# Patient Record
Sex: Female | Born: 1940 | Race: White | Hispanic: No | Smoking: Never smoker
Health system: Southern US, Community
[De-identification: ages and names within clinical notes are randomized; demographics above are authoritative.]

## PROBLEM LIST (undated history)

## (undated) DIAGNOSIS — E079 Disorder of thyroid, unspecified: Secondary | ICD-10-CM

## (undated) DIAGNOSIS — E785 Hyperlipidemia, unspecified: Secondary | ICD-10-CM

## (undated) DIAGNOSIS — I219 Acute myocardial infarction, unspecified: Secondary | ICD-10-CM

## (undated) HISTORY — PX: CHOLECYSTECTOMY: SHX55

## (undated) HISTORY — PX: HERNIA REPAIR: SHX51

---

## 2010-12-13 DIAGNOSIS — I219 Acute myocardial infarction, unspecified: Secondary | ICD-10-CM

## 2010-12-13 HISTORY — DX: Acute myocardial infarction, unspecified: I21.9

## 2010-12-13 HISTORY — PX: CORONARY STENT PLACEMENT: SHX1402

## 2016-01-14 ENCOUNTER — Encounter (HOSPITAL_COMMUNITY): Payer: Self-pay | Admitting: Emergency Medicine

## 2016-01-14 ENCOUNTER — Emergency Department (HOSPITAL_COMMUNITY)
Admission: EM | Admit: 2016-01-14 | Discharge: 2016-01-14 | Disposition: A | Discharge status: Home / Self Care | Payer: Medicare Other | Attending: Emergency Medicine | Admitting: Emergency Medicine

## 2016-01-14 ENCOUNTER — Emergency Department (HOSPITAL_COMMUNITY): Payer: Medicare Other

## 2016-01-14 DIAGNOSIS — S0101XA Laceration without foreign body of scalp, initial encounter: Secondary | ICD-10-CM | POA: Insufficient documentation

## 2016-01-14 DIAGNOSIS — Y998 Other external cause status: Secondary | ICD-10-CM | POA: Insufficient documentation

## 2016-01-14 DIAGNOSIS — S0191XA Laceration without foreign body of unspecified part of head, initial encounter: Secondary | ICD-10-CM

## 2016-01-14 DIAGNOSIS — Y9354 Activity, bowling: Secondary | ICD-10-CM | POA: Diagnosis not present

## 2016-01-14 DIAGNOSIS — Y9239 Other specified sports and athletic area as the place of occurrence of the external cause: Secondary | ICD-10-CM | POA: Insufficient documentation

## 2016-01-14 DIAGNOSIS — W01198A Fall on same level from slipping, tripping and stumbling with subsequent striking against other object, initial encounter: Secondary | ICD-10-CM | POA: Diagnosis not present

## 2016-01-14 DIAGNOSIS — I10 Essential (primary) hypertension: Secondary | ICD-10-CM | POA: Diagnosis not present

## 2016-01-14 DIAGNOSIS — S199XXA Unspecified injury of neck, initial encounter: Secondary | ICD-10-CM | POA: Insufficient documentation

## 2016-01-14 DIAGNOSIS — W19XXXA Unspecified fall, initial encounter: Secondary | ICD-10-CM

## 2016-01-14 DIAGNOSIS — S0990XA Unspecified injury of head, initial encounter: Secondary | ICD-10-CM | POA: Diagnosis present

## 2016-01-14 HISTORY — DX: Disorder of thyroid, unspecified: E07.9

## 2016-01-14 HISTORY — DX: Acute myocardial infarction, unspecified: I21.9

## 2016-01-14 HISTORY — DX: Hyperlipidemia, unspecified: E78.5

## 2016-01-14 MED ORDER — LIDOCAINE-EPINEPHRINE (PF) 2 %-1:200000 IJ SOLN
10.0000 mL | Freq: Once | INTRAMUSCULAR | Status: AC
  Administered 2016-01-14: 10 mL
  Filled 2016-01-14: qty 10

## 2016-01-14 MED ORDER — OXYCODONE-ACETAMINOPHEN 5-325 MG PO TABS: 2.0000 | ORAL_TABLET | Freq: Once | ORAL | Status: DC

## 2016-01-14 MED ORDER — ACETAMINOPHEN 325 MG PO TABS
650.0000 mg | ORAL_TABLET | Freq: Once | ORAL | Status: AC
  Administered 2016-01-14: 650 mg via ORAL
  Filled 2016-01-14: qty 2

## 2016-01-14 NOTE — Discharge Instructions (Signed)
Please go to your Primary Care Physician, an Urgent Care or return to the Emergency Department to have your staples or sutures removed 7 -10 days from today.Head Injury, Adult You have received a head injury. It does not appear serious at this time. Headaches and vomiting are common following head injury. It should be easy to awaken from sleeping. Sometimes it is necessary for you to stay in the emergency department for a while for observation. Sometimes admission to the hospital may be needed. After injuries such as yours, most problems occur within the first 24 hours, but side effects may occur up to 7-10 days after the injury. It is important for you to carefully monitor your condition and contact your health care provider or seek immediate medical care if there is a change in your condition. WHAT ARE THE TYPES OF HEAD INJURIES? Head injuries can be as minor as a bump. Some head injuries can be more severe. More severe head injuries include:  A jarring injury to the brain (concussion).  A bruise of the brain (contusion). This mean there is bleeding in the brain that can cause swelling.  A cracked skull (skull fracture).  Bleeding in the brain that collects, clots, and forms a bump (hematoma). WHAT CAUSES A HEAD INJURY? A serious head injury is most likely to happen to someone who is in a car wreck and is not wearing a seat belt. Other causes of major head injuries include bicycle or motorcycle accidents, sports injuries, and falls. HOW ARE HEAD INJURIES DIAGNOSED? A complete history of the event leading to the injury and your current symptoms will be helpful in diagnosing head injuries. Many times, pictures of the brain, such as CT or MRI are needed to see the extent of the injury. Often, an overnight hospital stay is necessary for observation.  WHEN SHOULD I SEEK IMMEDIATE MEDICAL CARE?  You should get help right away if:  You have confusion or drowsiness.  You feel sick to your stomach  (nauseous) or have continued, forceful vomiting.  You have dizziness or unsteadiness that is getting worse.  You have severe, continued headaches not relieved by medicine. Only take over-the-counter or prescription medicines for pain, fever, or discomfort as directed by your health care provider.  You do not have normal function of the arms or legs or are unable to walk.  You notice changes in the black spots in the center of the colored part of your eye (pupil).  You have a clear or bloody fluid coming from your nose or ears.  You have a loss of vision. During the next 24 hours after the injury, you must stay with someone who can watch you for the warning signs. This person should contact local emergency services (911 in the [place] if you have seizures, you become unconscious, or you are unable to wake up. HOW CAN I PREVENT A HEAD INJURY IN THE FUTURE? The most important factor for preventing major head injuries is avoiding motor vehicle accidents. To minimize the potential for damage to your head, it is crucial to wear seat belts while riding in motor vehicles. Wearing helmets while bike riding and playing collision sports (like football) is also helpful. Also, avoiding dangerous activities around the house will further help reduce your risk of head injury.  WHEN CAN I RETURN TO NORMAL ACTIVITIES AND ATHLETICS? You should be reevaluated by your health care provider before returning to these activities. If you have any of the following symptoms, you should not return  to activities or contact sports until 1 week after the symptoms have stopped:  Persistent headache.  Dizziness or vertigo.  Poor attention and concentration.  Confusion.  Memory problems.  Nausea or vomiting.  Fatigue or tire easily.  Irritability.  Intolerant of bright lights or loud noises.  Anxiety or depression.  Disturbed sleep. MAKE SURE YOU:   Understand these instructions.  Will watch your  condition.  Will get help right away if you are not doing well or get worse.   This information is not intended to replace advice given to you by your health care provider. Make sure you discuss any questions you have with your health care provider.   Document Released: 11/29/2005 Document Revised: 12/20/2014 Document Reviewed: 08/06/2013 Elsevier Interactive Patient Education [employer].

## 2016-01-14 NOTE — ED Notes (Signed)
MD at bedside. 

## 2016-01-14 NOTE — ED Provider Notes (Signed)
CSN: [id number]     Arrival date & time 01/14/16  1959 History   First MD Initiated Contact with Patient 01/14/16 2000     Chief Complaint  Patient presents with  . Fall     (Consider location/radiation/quality/duration/timing/severity/associated sxs/prior Treatment) HPI   75 year old female who slipped and fell backwards while bowling this evening. She denies loss of consciousness. She had significant amount of bleeding noted but has been controlled by EMS. EMS was called. She did not get up prior to EMS transporting her. She denies any other injury besides the back of head she states she has some pain on the left side of her neck. She denies any changes in vision, difficulty speaking, or lateralized weakness noted. She states that she is on some type of a blood thinner but is not sure what it is.  History reviewed. No pertinent past medical history. History reviewed. No pertinent past surgical history. No family history on file. Social History  Substance Use Topics  . Smoking status: None  . Smokeless tobacco: None  . Alcohol Use: None   OB History    No data available     Review of Systems  All other systems reviewed and are negative.     Allergies  Review of patient's allergies indicates not on file.  Home Medications   Prior to Admission medications   Not on File   BP 196/112 mmHg  Pulse 79  Temp(Src) 97.7 F (36.5 C) (Oral)  Resp 16  SpO2 100% Physical Exam  Constitutional: She appears well-developed and well-nourished. No distress.  HENT:  Head: Normocephalic.    Right Ear: External ear normal.  Left Ear: External ear normal.  Nose: Nose normal.  Mouth/Throat: Oropharynx is clear and moist.  Laceration with blood matted in hair.  Eyes: Conjunctivae and EOM are normal. Pupils are equal, round, and reactive to light.  Neck: Normal range of motion.    Nursing note and vitals reviewed.   ED Course  .Marland KitchenLaceration Repair Date/Time: 01/14/2016 9:48  PM Performed by: Margarita Grizzle Authorized by: Margarita Grizzle Consent: Verbal consent obtained. Risks and benefits: risks, benefits and alternatives were discussed Consent given by: patient Required items: required blood products, implants, devices, and special equipment available Patient identity confirmed: verbally with patient Time out: Immediately prior to procedure a "time out" was called to verify the correct patient, procedure, equipment, support staff and site/side marked as required. Body area: head/neck Location details: scalp Laceration length: 3 cm Foreign bodies: no foreign bodies Tendon involvement: none Nerve involvement: none Vascular damage: no Anesthesia: local infiltration Local anesthetic: lidocaine 1% with epinephrine Anesthetic total: 2 ml Patient sedated: no Preparation: Patient was prepped and draped in the usual sterile fashion. Irrigation solution: saline Amount of cleaning: standard Debridement: none Degree of undermining: none Skin closure: staples Number of sutures: 3 Technique: simple Approximation: close Approximation difficulty: simple Patient tolerance: Patient tolerated the procedure well with no immediate complications   (including critical care time) Labs Review Labs Reviewed - No data to display  Imaging Review Ct Head Wo Contrast  01/14/2016  CLINICAL DATA:  Fall. EXAM: CT HEAD WITHOUT CONTRAST CT CERVICAL SPINE WITHOUT CONTRAST TECHNIQUE: Multidetector CT imaging of the head and cervical spine was performed following the standard protocol without intravenous contrast. Multiplanar CT image reconstructions of the cervical spine were also generated. COMPARISON:  None. FINDINGS: CT HEAD FINDINGS Prominence of the sulci and ventricles are identified compatible with brain atrophy. There is mild low attenuation within the subcortical  and periventricular white matter compatible with chronic microvascular disease. No evidence for acute brain infarct,  intracranial hemorrhage or mass. The paranasal sinuses are clear. The mastoid air cells are also clear. The calvarium is intact. Left posterior scalp hematoma is identified measuring 3.5 cm. CT CERVICAL SPINE FINDINGS Reversal of normal cervical lordosis. There is multi level disc space narrowing and ventral endplate spurring compatible with degenerative disc disease. The facet joints appear well-aligned. The prevertebral soft tissue space is normal. IMPRESSION: 1. No acute intracranial abnormalities. 2. Chronic microvascular disease and brain atrophy. 3. No evidence for cervical spine fracture. 4. Multilevel cervical spondylosis. 5. Reversal of normal cervical lordosis which may reflect muscle spasm or patient positioning. Electronically Signed   By: Signa Kell M.D.   On: 01/14/2016 20:57   Ct Cervical Spine Wo Contrast  01/14/2016  CLINICAL DATA:  Fall. EXAM: CT HEAD WITHOUT CONTRAST CT CERVICAL SPINE WITHOUT CONTRAST TECHNIQUE: Multidetector CT imaging of the head and cervical spine was performed following the standard protocol without intravenous contrast. Multiplanar CT image reconstructions of the cervical spine were also generated. COMPARISON:  None. FINDINGS: CT HEAD FINDINGS Prominence of the sulci and ventricles are identified compatible with brain atrophy. There is mild low attenuation within the subcortical and periventricular white matter compatible with chronic microvascular disease. No evidence for acute brain infarct, intracranial hemorrhage or mass. The paranasal sinuses are clear. The mastoid air cells are also clear. The calvarium is intact. Left posterior scalp hematoma is identified measuring 3.5 cm. CT CERVICAL SPINE FINDINGS Reversal of normal cervical lordosis. There is multi level disc space narrowing and ventral endplate spurring compatible with degenerative disc disease. The facet joints appear well-aligned. The prevertebral soft tissue space is normal. IMPRESSION: 1. No acute  intracranial abnormalities. 2. Chronic microvascular disease and brain atrophy. 3. No evidence for cervical spine fracture. 4. Multilevel cervical spondylosis. 5. Reversal of normal cervical lordosis which may reflect muscle spasm or patient positioning. Electronically Signed   By: Signa Kell M.D.   On: 01/14/2016 20:57   I have personally reviewed and evaluated these images and lab results as part of my medical decision-making.   EKG Interpretation None      MDM   Final diagnoses:  Laceration of head, initial encounter  Fall, initial encounter  Essential hypertension    75 year old female who fell tonight while bowling. Laceration to occiput repaired here with staples. Head CT reveals no evidence of acute intracranial hemorrhage. Patient is hypertensive. Recheck blood pressure. Likely elevated due to emergency situation, pain. Patient instructed to have recheck blood pressure with her primary care doctor. She is to have staples out in 7 days.  . Laceration occurred < 8 hours prior to repair which was well tolerated. Pt has no co morbidities to effect normal wound healing. Discussed suture home care w pt and answered questions. Pt to f-u for wound check and suture removal in 7 days. Pt is hemodynamically stable w no complaints prior to dc.       Margarita Grizzle, MD 01/14/16 [phone]

## 2016-01-14 NOTE — ED Notes (Signed)
Pt presents to ED with [facility] from [place] where she slid and fell backward striking posterior head; pt denies LOC; EMS notes lac to posterior head with bleeding controlled; pt reports anticoagulant use but unknown name; pt also c/o L lateral neck pain; EMS reports pt cleared [facility] assessment; Pt denies n/v

## 2016-01-14 NOTE — ED Notes (Signed)
Hair care done, dried blood removed.  Patient feeling "refreshed"

## 2016-01-20 ENCOUNTER — Emergency Department (HOSPITAL_COMMUNITY)
Admission: EM | Admit: 2016-01-20 | Discharge: 2016-01-20 | Disposition: A | Discharge status: Home / Self Care | Payer: Medicare Other | Attending: Emergency Medicine | Admitting: Emergency Medicine

## 2016-01-20 ENCOUNTER — Encounter (HOSPITAL_COMMUNITY): Payer: Self-pay | Admitting: Emergency Medicine

## 2016-01-20 DIAGNOSIS — Z79899 Other long term (current) drug therapy: Secondary | ICD-10-CM | POA: Diagnosis not present

## 2016-01-20 DIAGNOSIS — E785 Hyperlipidemia, unspecified: Secondary | ICD-10-CM | POA: Diagnosis not present

## 2016-01-20 DIAGNOSIS — E0590 Thyrotoxicosis, unspecified without thyrotoxic crisis or storm: Secondary | ICD-10-CM | POA: Insufficient documentation

## 2016-01-20 DIAGNOSIS — I252 Old myocardial infarction: Secondary | ICD-10-CM | POA: Diagnosis not present

## 2016-01-20 DIAGNOSIS — Z880 Allergy status to penicillin: Secondary | ICD-10-CM | POA: Diagnosis not present

## 2016-01-20 DIAGNOSIS — Z4802 Encounter for removal of sutures: Secondary | ICD-10-CM | POA: Insufficient documentation

## 2016-01-20 DIAGNOSIS — Z955 Presence of coronary angioplasty implant and graft: Secondary | ICD-10-CM | POA: Insufficient documentation

## 2016-01-20 NOTE — ED Notes (Signed)
Pt returned for removal of staples in scalp.  No complaints voiced

## 2016-01-20 NOTE — ED Provider Notes (Signed)
CSN: [id number]     Arrival date & time 01/20/16  1645 History  By signing my name below, I, Ronney Lion, attest that this documentation has been prepared under the direction and in the presence of S. Lane Hacker, PA-C. Electronically Signed: Ronney Lion, ED Scribe. 01/20/2016. 5:00 PM.     Chief Complaint  Patient presents with  . Suture / Staple Removal   The history is provided by the patient. No language interpreter was used.   HPI Comments: Anne Roth is a 75 y.o. female with a history of hyperthyroidism, HLD, and MI, who presents to the Emergency Department for a removal of 3 staples on her posterior scalp due to a head injury that occurred 6 days ago, on 01/14/16, when patient slipped and fell backwards while bowling. She states she has kept the area dry and only started washing her hair 2 days ago, per instructions for wound care. Patient denies any problems or complaints since having the staples placed. She denies fever, chills, or drainage from the area. She is on aspirin, otherwise no anticoagulant use.  Past Medical History  Diagnosis Date  . Thyroid disease     Hyperthyroidism  . Hyperlipidemia   . MI (myocardial infarction) ([facility]) 2012   Past Surgical History  Procedure Laterality Date  . Coronary stent placement  2012    x2  . Cholecystectomy    . Hernia repair     No family history on file. Social History  Substance Use Topics  . Smoking status: Never Smoker   . Smokeless tobacco: Not on file  . Alcohol Use: 1.2 oz/week    2 Glasses of wine per week   OB History    No data available     Review of Systems  Constitutional: Negative for fever and chills.  Skin: Positive for wound (well-healing).       Negative for skin drainage    Allergies  Amoxicillin  Home Medications   Prior to Admission medications   Medication Sig Start Date End Date Taking? Authorizing Provider  aspirin EC 81 MG tablet Take 81 mg by mouth daily.    Historical Provider, MD   atorvastatin (LIPITOR) 40 MG tablet Take 40 mg by mouth daily.    Historical Provider, MD  levothyroxine (SYNTHROID, LEVOTHROID) 50 MCG tablet Take 50 mcg by mouth daily before breakfast.    Historical Provider, MD  lisinopril (PRINIVIL,ZESTRIL) 10 MG tablet Take 10 mg by mouth daily.    Historical Provider, MD   BP 186/84 mmHg  Pulse 65  Temp(Src) 97.7 F (36.5 C) (Oral)  Resp 20  Ht  (1.676 m)  Wt 104.327 kg  BMI 37.14 kg/m2  SpO2 98% Physical Exam  Constitutional: She is oriented to person, place, and time. She appears well-developed and well-nourished. No distress.  HENT:  Head: Normocephalic and atraumatic.  Eyes: Conjunctivae and EOM are normal.  Neck: Neck supple. No tracheal deviation present.  Cardiovascular: Normal rate.   Pulmonary/Chest: Effort normal. No respiratory distress.  Musculoskeletal: Normal range of motion.  Neurological: She is alert and oriented to person, place, and time.  Skin: Skin is warm and dry.  3 staples in head. There is some surrounding dried blood present. No erythema   Psychiatric: She has a normal mood and affect. Her behavior is normal.  Nursing note and vitals reviewed.   ED Course  Procedures   DIAGNOSTIC STUDIES: Oxygen Saturation is 98% on RA, normal by my interpretation.    COORDINATION OF  CARE: 4:55 PM - Discussed treatment plan with pt at bedside which includes removal of staples. Pt verbalized understanding and agreed to plan.   SUTURE REMOVAL Performed by: S. Lane Hacker, PA-C Authorized by: Cathren Laine, MD Consent: Verbal consent obtained. Consent given by: Patient Required items: required blood products, implants, devices, and special equipment available  Time out: Immediately prior to procedure a "time out" was called to verify the correct patient, procedure, equipment, support staff and site/side marked as required. Location: Scalp Wound Appearance: Clean, well-healing  Staples Removed: 3 Post-removal:  n/a Patient tolerance: Patient tolerated the procedure well with no immediate complications.    MDM   Final diagnoses:  Visit for suture removal   Staple removal   Pt to ER for staple removal and wound check as above. Procedure tolerated well. Vitals stable, no signs of infection. Scar minimization & return precautions given at dc.   I personally performed the services described in this documentation, which was scribed in my presence. The recorded information has been reviewed and is accurate.  Melton Krebs, PA-C 01/20/16 1711  Cathren Laine, MD 01/21/16 [phone]

## 2016-01-20 NOTE — Discharge Instructions (Signed)
Ms. Anne Roth,  Nice meeting you! Please follow-up with your primary care provider as needed. Return to the emergency department if you develop chills, fevers, yellow or green drainage from the wound, increasing pain. Feel better soon!  S. Lane Hacker, PA-C  Suture Removal, Care After Refer to this sheet in the next few weeks. These instructions provide you with information on caring for yourself after your procedure. Your health care provider may also give you more specific instructions. Your treatment has been planned according to current medical practices, but problems sometimes occur. Call your health care provider if you have any problems or questions after your procedure. WHAT TO EXPECT AFTER THE PROCEDURE After your stitches (sutures) are removed, it is typical to have the following:  Some discomfort and swelling in the wound area.  Slight redness in the area. HOME CARE INSTRUCTIONS   If you have skin adhesive strips over the wound area, do not take the strips off. They will fall off on their own in a few days. If the strips remain in place after 14 days, you may remove them.  Change any bandages (dressings) at least once a day or as directed by your health care provider. If the bandage sticks, soak it off with warm, soapy water.  Apply cream or ointment only as directed by your health care provider. If using cream or ointment, wash the area with soap and water 2 times a day to remove all the cream or ointment. Rinse off the soap and pat the area dry with a clean towel.  Keep the wound area dry and clean. If the bandage becomes wet or dirty, or if it develops a bad smell, change it as soon as possible.  Continue to protect the wound from injury.  Use sunscreen when out in the sun. New scars become sunburned easily. SEEK MEDICAL CARE IF:  You have increasing redness, swelling, or pain in the wound.  You see pus coming from the wound.  You have a fever.  You notice a  bad smell coming from the wound or dressing.  Your wound breaks open (edges not staying together).   This information is not intended to replace advice given to you by your health care provider. Make sure you discuss any questions you have with your health care provider.   Document Released: 08/24/2001 Document Revised: 09/19/2013 Document Reviewed: 07/11/2013 Elsevier Interactive Patient Education [employer]

## 2016-09-02 IMAGING — CT CT CERVICAL SPINE W/O CM
4 of 6 series · 14 of 33 positions shown, 16 images · non-contrast
Comparison: None.

CLINICAL DATA: Fall.

EXAM:
CT HEAD WITHOUT CONTRAST
CT CERVICAL SPINE WITHOUT CONTRAST
TECHNIQUE: Multidetector CT imaging of the head and cervical spine was
performed following the standard protocol without intravenous
contrast. Multiplanar CT image reconstructions of the cervical spine
were also generated.

[Series 6: c_spine 2.0 st · axial · 0.35mm/px · z∈[+126,+248]mm · 3 of 123 slices shown, 4 images]
[im 31/123  soft-tissue]
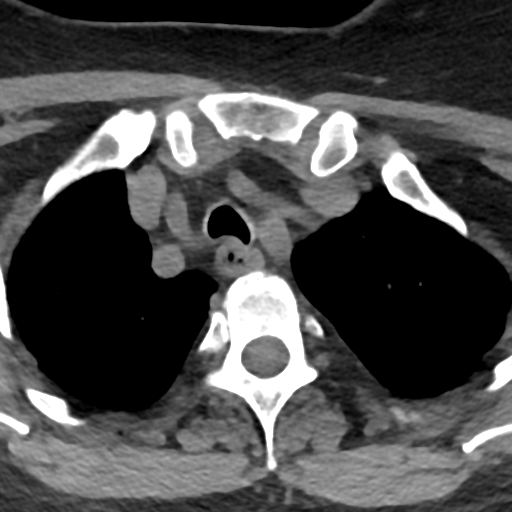
[im 31/123  bone]
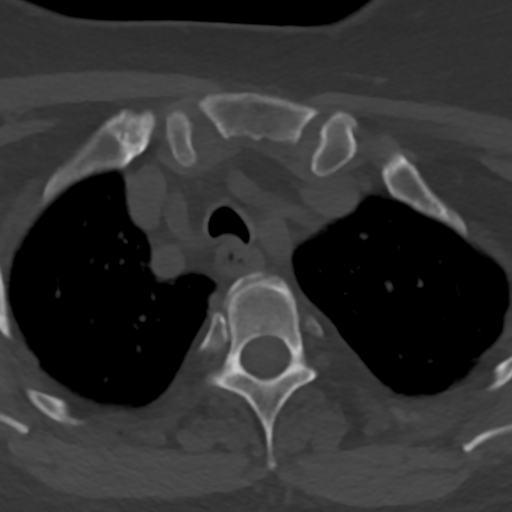
[im 62/123  bone]
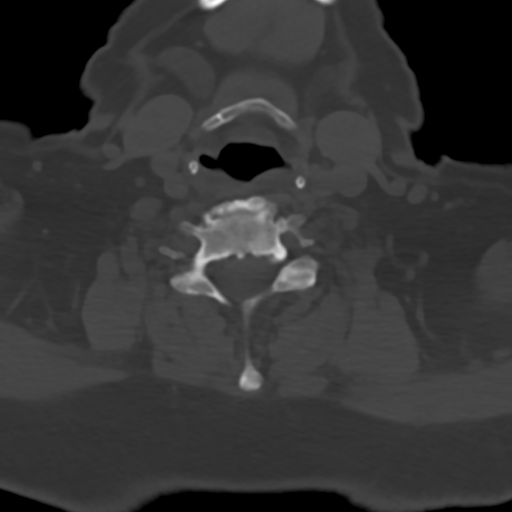
[im 92/123  bone]
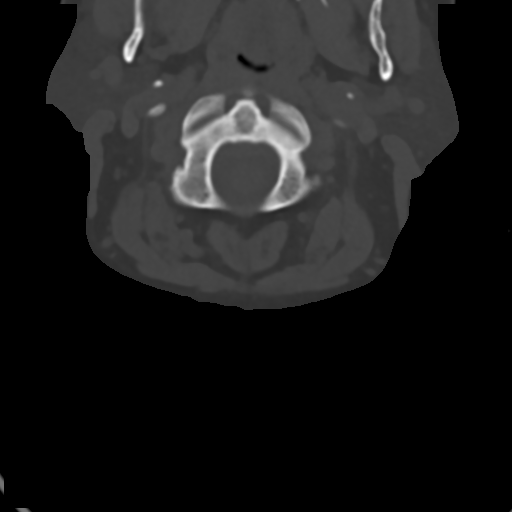

[Series 8: c_spine 2.0 sag bone · sagittal · 0.37mm/px · 5 of 61 slices shown, 6 images]
[im 21/61  bone]
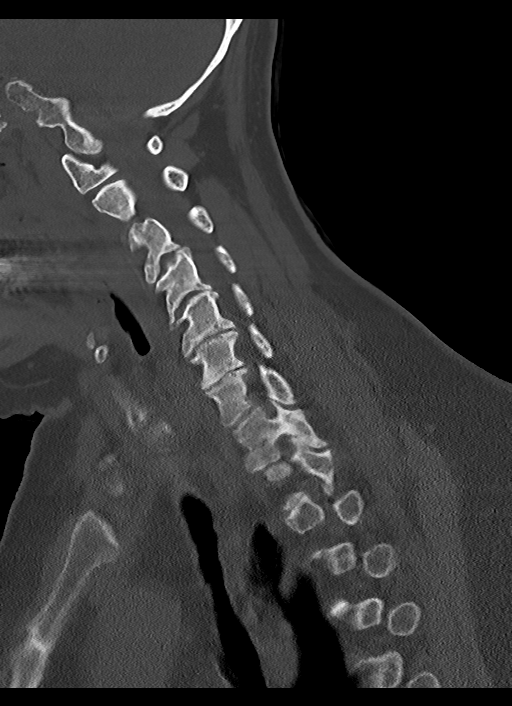
[im 26/61  bone]
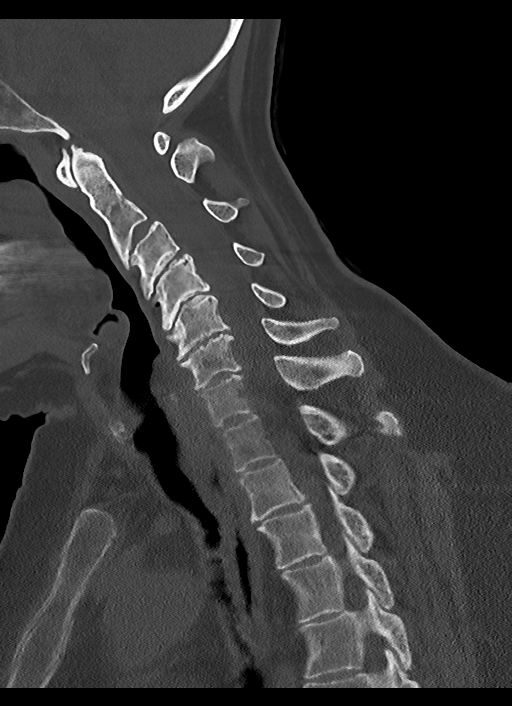
[im 31/61  soft-tissue]
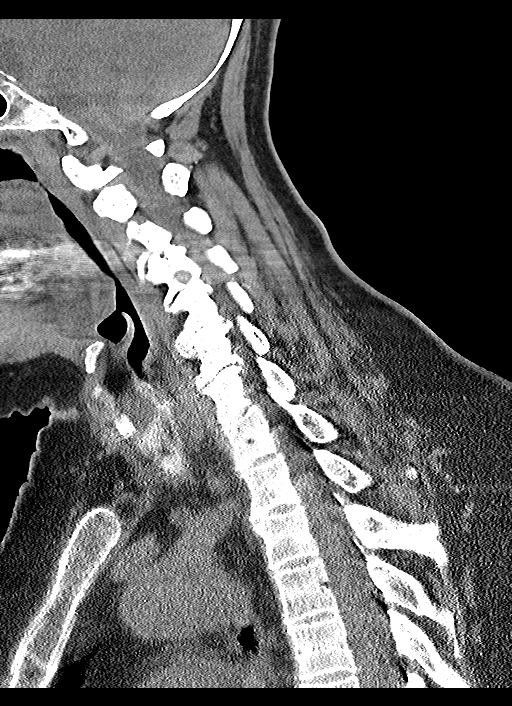
[im 31/61  bone]
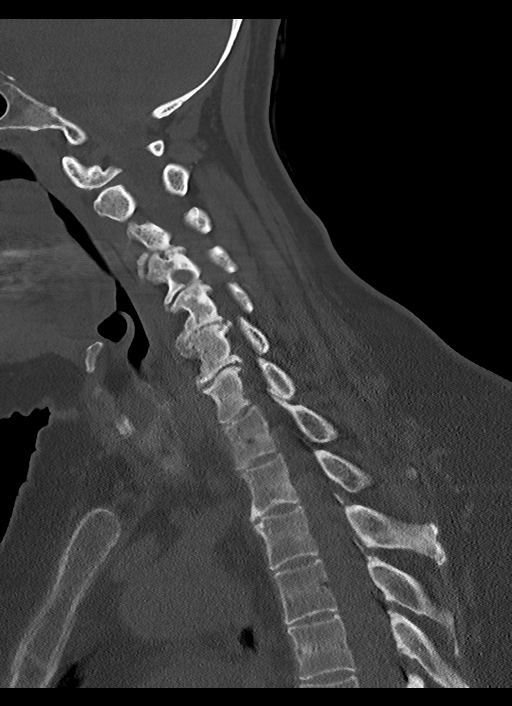
[im 36/61  bone]
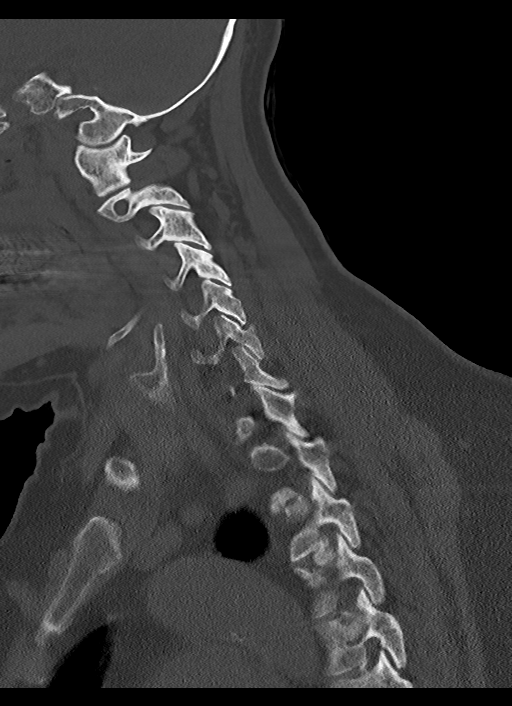
[im 41/61  bone]
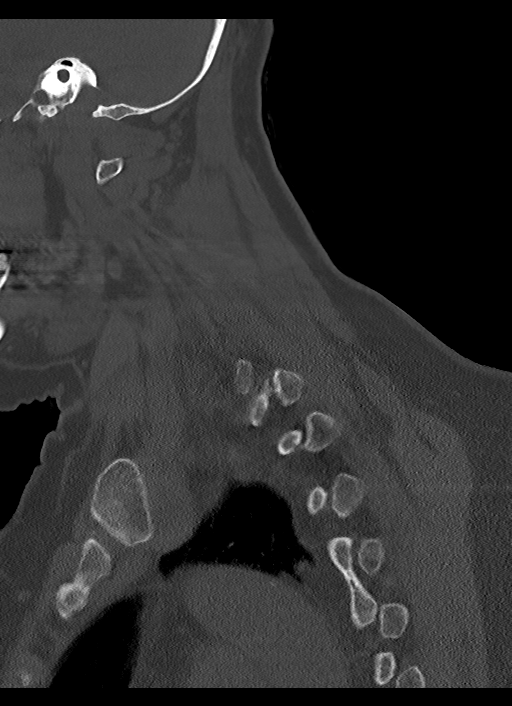

[Series 9: c_spine 2.0 cor bone · coronal · 0.42mm/px · 3 of 62 slices shown]
[im 14/62  bone]
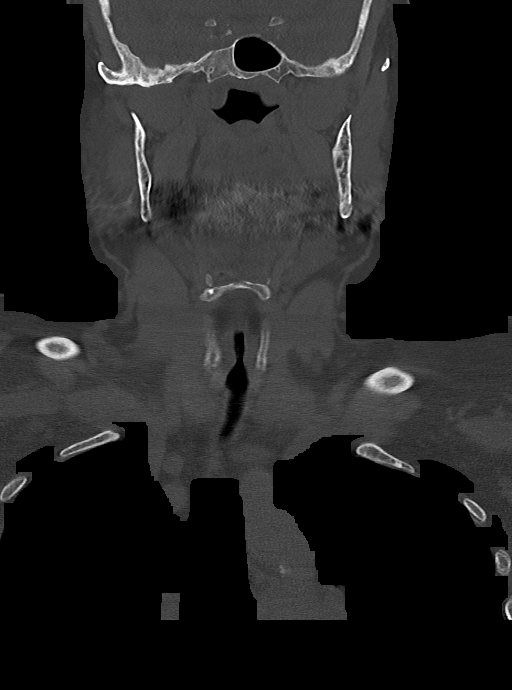
[im 25/62  bone]
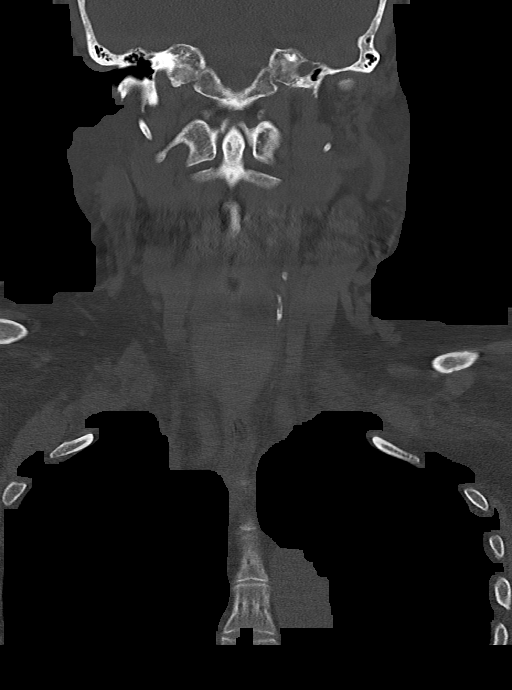
[im 37/62  bone]
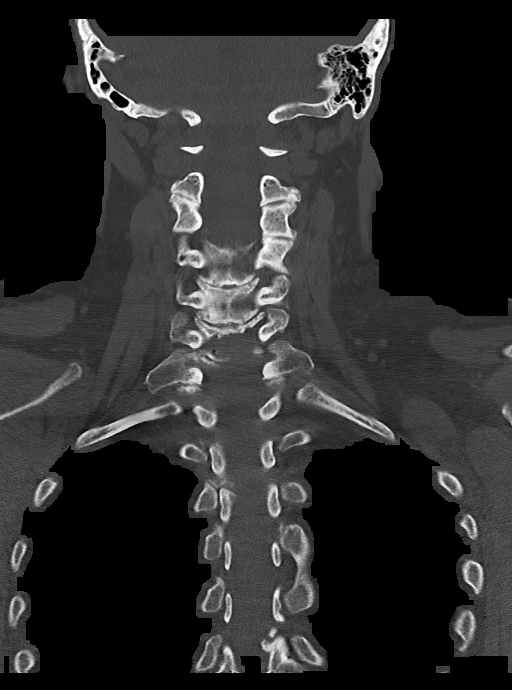

[Series 11: c_spine 2.0 orthogonals · axial · 0.21mm/px · z∈[+111,+210]mm · 3 of 121 slices shown]
[im 31/121  bone]
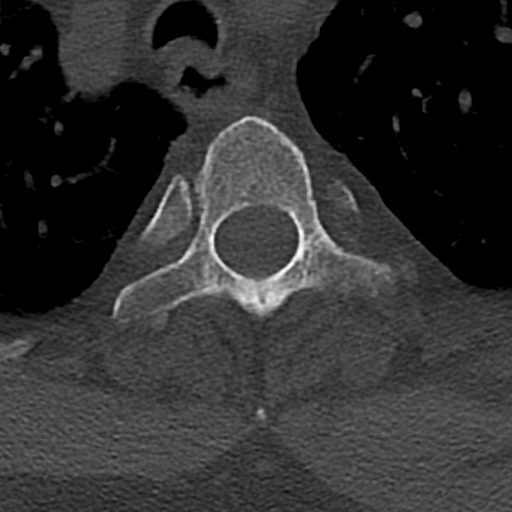
[im 61/121  bone]
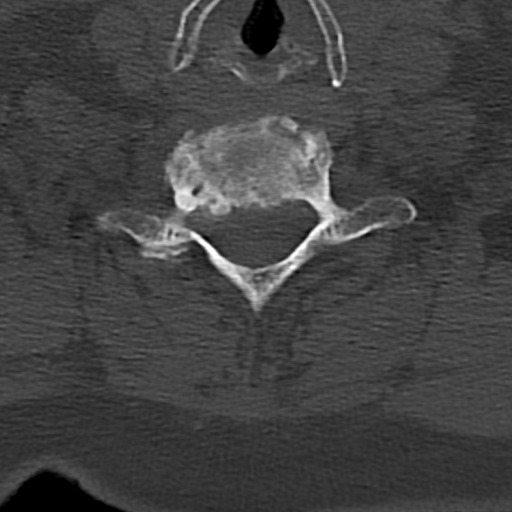
[im 91/121  bone]
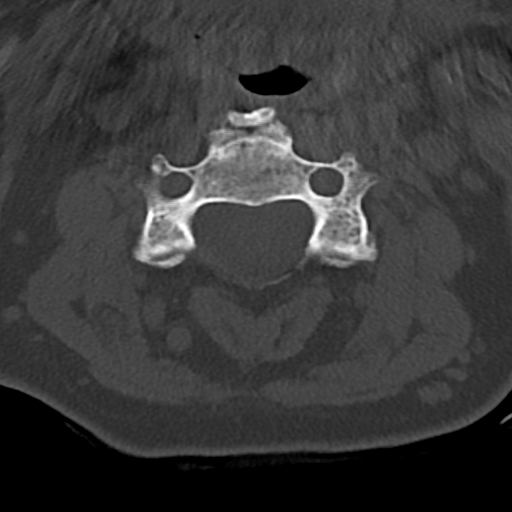

[14 of 33 positions shown; findings below may reference images not displayed]

FINDINGS: CT HEAD FINDINGS

Prominence of the sulci and ventricles are identified compatible
with brain atrophy. There is mild low attenuation within the
subcortical and periventricular white matter compatible with chronic
microvascular disease. No evidence for acute brain infarct,
intracranial hemorrhage or mass. The paranasal sinuses are clear.
The mastoid air cells are also clear. The calvarium is intact. Left
posterior scalp hematoma is identified measuring 3.5 cm.

CT CERVICAL SPINE FINDINGS

Reversal of normal cervical lordosis. There is multi level disc
space narrowing and ventral endplate spurring compatible with
degenerative disc disease. The facet joints appear well-aligned. The
prevertebral soft tissue space is normal.
IMPRESSION: 1. No acute intracranial abnormalities.
2. Chronic microvascular disease and brain atrophy.
3. No evidence for cervical spine fracture.
4. Multilevel cervical spondylosis.
5. Reversal of normal cervical lordosis which may reflect muscle
spasm or patient positioning.
